# Patient Record
Sex: Female | Born: 1935 | Race: White | Hispanic: No | Smoking: Never smoker
Health system: Southern US, Community
[De-identification: ages and names within clinical notes are randomized; demographics above are authoritative.]

## PROBLEM LIST (undated history)

## (undated) DIAGNOSIS — F0280 Dementia in other diseases classified elsewhere without behavioral disturbance: Secondary | ICD-10-CM

## (undated) DIAGNOSIS — I639 Cerebral infarction, unspecified: Secondary | ICD-10-CM

## (undated) DIAGNOSIS — E785 Hyperlipidemia, unspecified: Secondary | ICD-10-CM

## (undated) DIAGNOSIS — I959 Hypotension, unspecified: Secondary | ICD-10-CM

## (undated) DIAGNOSIS — G3183 Dementia with Lewy bodies: Secondary | ICD-10-CM

## (undated) DIAGNOSIS — G20 Parkinson's disease: Secondary | ICD-10-CM

---

## 2018-06-03 ENCOUNTER — Emergency Department (HOSPITAL_COMMUNITY): Payer: Medicare Other

## 2018-06-03 ENCOUNTER — Emergency Department (HOSPITAL_COMMUNITY)
Admission: EM | Admit: 2018-06-03 | Discharge: 2018-06-03 | Disposition: A | Discharge status: Home / Self Care | Payer: Medicare Other | Attending: Emergency Medicine | Admitting: Emergency Medicine

## 2018-06-03 ENCOUNTER — Other Ambulatory Visit: Payer: Self-pay

## 2018-06-03 ENCOUNTER — Encounter (HOSPITAL_COMMUNITY): Payer: Self-pay | Admitting: *Deleted

## 2018-06-03 DIAGNOSIS — M7071 Other bursitis of hip, right hip: Secondary | ICD-10-CM | POA: Diagnosis not present

## 2018-06-03 DIAGNOSIS — Z8673 Personal history of transient ischemic attack (TIA), and cerebral infarction without residual deficits: Secondary | ICD-10-CM | POA: Insufficient documentation

## 2018-06-03 DIAGNOSIS — Y998 Other external cause status: Secondary | ICD-10-CM | POA: Diagnosis not present

## 2018-06-03 DIAGNOSIS — Z79899 Other long term (current) drug therapy: Secondary | ICD-10-CM | POA: Insufficient documentation

## 2018-06-03 DIAGNOSIS — Y9389 Activity, other specified: Secondary | ICD-10-CM | POA: Insufficient documentation

## 2018-06-03 DIAGNOSIS — X58XXXA Exposure to other specified factors, initial encounter: Secondary | ICD-10-CM | POA: Diagnosis not present

## 2018-06-03 DIAGNOSIS — G20 Parkinson's disease: Secondary | ICD-10-CM | POA: Diagnosis not present

## 2018-06-03 DIAGNOSIS — S7001XA Contusion of right hip, initial encounter: Secondary | ICD-10-CM

## 2018-06-03 DIAGNOSIS — F0390 Unspecified dementia without behavioral disturbance: Secondary | ICD-10-CM | POA: Insufficient documentation

## 2018-06-03 DIAGNOSIS — Y92129 Unspecified place in nursing home as the place of occurrence of the external cause: Secondary | ICD-10-CM | POA: Diagnosis not present

## 2018-06-03 DIAGNOSIS — Z7982 Long term (current) use of aspirin: Secondary | ICD-10-CM | POA: Insufficient documentation

## 2018-06-03 DIAGNOSIS — S79911A Unspecified injury of right hip, initial encounter: Secondary | ICD-10-CM | POA: Diagnosis present

## 2018-06-03 HISTORY — DX: Dementia in other diseases classified elsewhere, unspecified severity, without behavioral disturbance, psychotic disturbance, mood disturbance, and anxiety: F02.80

## 2018-06-03 HISTORY — DX: Hyperlipidemia, unspecified: E78.5

## 2018-06-03 HISTORY — DX: Cerebral infarction, unspecified: I63.9

## 2018-06-03 HISTORY — DX: Dementia with Lewy bodies: G31.83

## 2018-06-03 HISTORY — DX: Hypotension, unspecified: I95.9

## 2018-06-03 HISTORY — DX: Parkinson's disease: G20

## 2018-06-03 NOTE — ED Provider Notes (Signed)
[facility]-EMERGENCY DEPT Provider Note   CSN: [id number] Arrival date & time: 06/03/18  1208     History   Chief Complaint Chief Complaint  Patient presents with  . Rt hip Swelling    HPI Melissa Davidson is a 84 y.o. female.  The history is provided by the nursing home and the EMS personnel. The history is limited by the condition of the patient (Hx dementia).   Pt was seen at 1225. Per EMS and NH report: NH noticed "swelling" to her right hip. No reported shortening, rotation or bruising. Pt has significant hx of dementia and currently denies complaints.   Past Medical History:  Diagnosis Date  . Hyperlipidemia   . Hypotension   . Lewy body dementia (HCC)   . Parkinson disease (HCC)   . Stroke [facility])     There are no active problems to display for this patient.      OB History   None      Home Medications    Prior to Admission medications   Medication Sig Start Date End Date Taking? Authorizing Provider  aspirin 81 MG chewable tablet Chew 81 mg by mouth daily.   Yes [provider]  busPIRone (BUSPAR) 15 MG tablet Take 15 mg by mouth 3 (three) times daily.   Yes [provider]  carbidopa-levodopa (SINEMET CR) 50-200 MG tablet Take 1 tablet by mouth at bedtime. 9 pm   Yes [provider]  carbidopa-levodopa (SINEMET IR) 25-100 MG tablet Take 1 tablet by mouth 2 (two) times daily. 9 am and 3 pm   Yes [provider]  carboxymethylcellulose (REFRESH PLUS) 0.5 % SOLN Apply 1 drop to eye daily as needed (both eyes dry).   Yes [provider]  Cholecalciferol (VITAMIN D-3) 1000 units CAPS Take 2,000 Units by mouth daily.   Yes [provider]  diclofenac sodium (VOLTAREN) 1 % GEL Apply 2 g topically 4 (four) times daily.   Yes [provider]  docusate sodium (COLACE) 100 MG capsule Take 100 mg by mouth daily.   Yes [provider]  Eyelid Cleansers (OCUSOFT EYELID  CLEANSING EX) Apply 1 application topically every morning. Apply one lid cleanser pad on each eye every morning   Yes [provider]  LORazepam (ATIVAN) 0.5 MG tablet Take 0.5 mg by mouth every 4 (four) hours as needed for anxiety.   Yes [provider]  Melatonin 5 MG TABS Take 10 mg by mouth at bedtime.   Yes [provider]  Multiple Vitamin (MULTIVITAMIN WITH MINERALS) TABS tablet Take 1 tablet by mouth daily.   Yes [provider]  Pimavanserin Tartrate (NUPLAZID) 34 MG CAPS Take 34 mg by mouth daily.   Yes [provider]  QUEtiapine (SEROQUEL) 50 MG tablet Take 50 mg by mouth 2 (two) times daily. Take one at noon and one at bedtime   Yes [provider]  rotigotine (NEUPRO) 2 MG/24HR Place 1 patch onto the skin daily.   Yes [provider]  senna-docusate (SENOKOT-S) 8.6-50 MG tablet Take 1 tablet by mouth daily.   Yes [provider]  simvastatin (ZOCOR) 5 MG tablet Take 5 mg by mouth daily.   Yes [provider]  temazepam (RESTORIL) 15 MG capsule Take 15 mg by mouth at bedtime.   Yes [provider]  traZODone (DESYREL) 150 MG tablet Take 150 mg by mouth at bedtime.   Yes [provider]    Family History  No family history on file.  Social History Social History   Tobacco Use  . Smoking status: Never Smoker  . Smokeless tobacco: Never Used  Substance Use Topics  . Alcohol use: Not Currently  . Drug use: Never     Allergies   Sulfa antibiotics   Review of Systems Review of Systems  Unable to perform ROS: Dementia     Physical Exam Updated Vital Signs BP 100/62   Pulse 71   Temp 98.9 F (37.2 C) (Oral)   Resp 17   SpO2 100%   Physical Exam 1230: Physical examination:  Nursing notes reviewed; Vital signs and O2 SAT reviewed;  Constitutional: Well developed, Well nourished, Well hydrated, In no acute distress; Head:  Normocephalic, atraumatic; Eyes: EOMI, PERRL,  No scleral icterus; ENMT: Mouth and pharynx normal, Mucous membranes moist; Neck: Supple, Full range of motion, No lymphadenopathy; Cardiovascular: Regular rate and rhythm, No gallop; Respiratory: Breath sounds clear & equal bilaterally, No wheezes. Normal respiratory effort/excursion; Chest: Nontender, Movement normal; Abdomen: Soft, Nontender, Nondistended, Normal bowel sounds; Genitourinary: No CVA tenderness; Spine:  No midline CS, TS, LS tenderness.;; Extremities: Peripheral pulses normal, Pelvis stable. +possible mild asymmetry to right lateral hip compared to left. No tenderness, No deformity, no shortening, no rotation, no bruising, no rash, no open wounds.  No calf edema or asymmetry.; Neuro: Awake, alert, confused per hx dementia. No facial droop. Speaks few words clearly. Pt will move all extremities on stretcher, including lifting bilat LE's, spontaneously and to command without apparent gross focal motor deficits.; Skin: Color normal, Warm, Dry.   ED Treatments / Results  Labs (all labs ordered are listed, but only abnormal results are displayed)   EKG None  Radiology   Procedures Procedures (including critical care time)  Medications Ordered in ED Medications - No data to display   Initial Impression / Assessment and Plan / ED Course  I have reviewed the triage vital signs and the nursing notes.  Pertinent labs & imaging results that were available during my care of the patient were reviewed by me and considered in my medical decision making (see chart for details).  MDM Reviewed: nursing note, vitals and previous chart Interpretation: x-ray and CT scan     Ct Hip Right Wo Contrast Result Date: 06/03/2018 CLINICAL DATA:  Right hip pain and swelling. The patient is unable to provide history and it is unclear if she fell. Initial encounter. EXAM: CT OF THE RIGHT HIP WITHOUT CONTRAST TECHNIQUE: Multidetector CT imaging of the right hip was performed according to the  standard protocol. Multiplanar CT image reconstructions were also generated. COMPARISON:  Plain films of both hips earlier today. FINDINGS: Bones/Joint/Cartilage Bones are osteopenic. The right hip is located. No fracture is identified. No focal bony lesion. No avascular necrosis of the right femoral head. Vacuum disc phenomenon and a disc bulge at L5-S1 are noted. Ligaments Suboptimally assessed by CT. Muscles and Tendons Appear intact. Soft tissues There is stranding in subcutaneous fat adjacent to the right hip. Small subcutaneous calcifications over the right hip and buttock are likely injection granulomas. There appears to be fluid in the right trochanteric bursa. IMPRESSION: Negative for fracture or other acute bony or joint abnormality. Osteopenia limits sensitivity for hip fracture on CT. If the patient is unable to bear weight, MRI without contrast is recommended for further evaluation. Stranding in subcutaneous fat over the right hip may be due to contusion. Fluid in the right trochanteric bursa compatible with bursitis. Electronically Signed  By: Drusilla Kanner M.D.   On: 06/03/2018 15:35   Dg Hips Bilat W Or Wo Pelvis 5 Views Result Date: 06/03/2018 CLINICAL DATA:  Hip pain and swelling. It is unknown if the patient fell. Initial encounter. EXAM: DG HIP (WITH OR WITHOUT PELVIS) 5+V BILAT COMPARISON:  None. FINDINGS: There is no evidence of hip fracture or dislocation. There is no evidence of arthropathy or other focal bone abnormality. IMPRESSION: Negative exam. Electronically Signed   By: Drusilla Kanner M.D.   On: 06/03/2018 13:15    1540:  CT as above. Pt without pain on exam, no rash. Tx symptomatically, f/u PMD. Will d/c back to NH.      Final Clinical Impressions(s) / ED Diagnoses   Final diagnoses:  None    ED Discharge Orders    None       Samuel Jester, DO 06/07/18 1040

## 2018-06-03 NOTE — Discharge Instructions (Signed)
Take your usual prescriptions as previously directed.  Apply moist heat or ice to the area(s) of discomfort, for 15 minutes at a time, several times per day for the next few days.  Do not fall asleep on a heating or ice pack.  Call your regular medical doctor on Monday to schedule a follow up appointment in the next 3 days.  Return to the Emergency Department immediately if worsening.  ° °

## 2018-06-03 NOTE — ED Notes (Signed)
Pt transported back to [facility] by [facility]

## 2018-06-03 NOTE — ED Notes (Signed)
Pt sleeping at present, waiting for CT scan

## 2018-06-03 NOTE — ED Notes (Signed)
Pt moved to room and undressed, no signs of injury, slight soft tissue swelling noted.

## 2018-06-03 NOTE — ED Triage Notes (Addendum)
[facility] reports pt from [facility], swelling to right hip unknown if related to fall. No noted distress, No shortening, rotation or bruising 102/68-78-16-98% RA

## 2018-06-05 ENCOUNTER — Emergency Department (HOSPITAL_COMMUNITY): Payer: Medicare Other

## 2018-06-05 ENCOUNTER — Encounter (HOSPITAL_COMMUNITY): Payer: Self-pay | Admitting: Emergency Medicine

## 2018-06-05 ENCOUNTER — Emergency Department (HOSPITAL_COMMUNITY)
Admission: EM | Admit: 2018-06-05 | Discharge: 2018-06-05 | Disposition: A | Discharge status: Home / Self Care | Payer: Medicare Other | Attending: Emergency Medicine | Admitting: Emergency Medicine

## 2018-06-05 ENCOUNTER — Other Ambulatory Visit: Payer: Self-pay

## 2018-06-05 DIAGNOSIS — W19XXXA Unspecified fall, initial encounter: Secondary | ICD-10-CM | POA: Diagnosis not present

## 2018-06-05 DIAGNOSIS — Y999 Unspecified external cause status: Secondary | ICD-10-CM | POA: Insufficient documentation

## 2018-06-05 DIAGNOSIS — Y92129 Unspecified place in nursing home as the place of occurrence of the external cause: Secondary | ICD-10-CM | POA: Insufficient documentation

## 2018-06-05 DIAGNOSIS — Z79899 Other long term (current) drug therapy: Secondary | ICD-10-CM | POA: Diagnosis not present

## 2018-06-05 DIAGNOSIS — G20 Parkinson's disease: Secondary | ICD-10-CM | POA: Insufficient documentation

## 2018-06-05 DIAGNOSIS — S20212A Contusion of left front wall of thorax, initial encounter: Secondary | ICD-10-CM | POA: Diagnosis not present

## 2018-06-05 DIAGNOSIS — Y939 Activity, unspecified: Secondary | ICD-10-CM | POA: Diagnosis not present

## 2018-06-05 DIAGNOSIS — Z7982 Long term (current) use of aspirin: Secondary | ICD-10-CM | POA: Insufficient documentation

## 2018-06-05 DIAGNOSIS — S299XXA Unspecified injury of thorax, initial encounter: Secondary | ICD-10-CM | POA: Diagnosis present

## 2018-06-05 MED ORDER — ACETAMINOPHEN 325 MG PO TABS
650.0000 mg | ORAL_TABLET | Freq: Once | ORAL | Status: AC
  Administered 2018-06-05: 650 mg via ORAL
  Filled 2018-06-05: qty 2

## 2018-06-05 NOTE — ED Triage Notes (Signed)
Patient arrives with c/o witnessed fall. No LOC, did not hit head. Right rib pain. Hx dementia. No obvious injuries. From [facility].

## 2018-06-05 NOTE — ED Notes (Signed)
[facility] called, report given.

## 2018-06-05 NOTE — ED Notes (Signed)
[facility] called for transport.  

## 2018-06-05 NOTE — Discharge Instructions (Signed)
It was our pleasure to provide your ER care today - we hope that you feel better.  The xrays were read by our radiologist as showing no acute fracture.  Take acetaminophen as need.  Return to ER if worse, new symptoms, new or severe pain, fevers, trouble breathing, other concern.

## 2018-06-05 NOTE — ED Provider Notes (Signed)
[facility]-EMERGENCY DEPT Provider Note   CSN: [id number] Arrival date & time: 06/05/18  1624     History   Chief Complaint Chief Complaint  Patient presents with  . Fall  . Rib Injury    HPI Melissa Davidson is a 84 y.o. female.  Pt with hx falls, s/p witnessed fall at [facility]. No loc or head injury w fall. Pts mental status has remained c/w baseline post fall. No vomiting post fall. Skin intact, no bleeding post fall .Pt c/o left lower/lateral rib pain. Patient limited historian due to dementia - level 5 caveat. Denies other pain or injury.   The history is provided by the patient and the EMS personnel.  Fall     Past Medical History:  Diagnosis Date  . Hyperlipidemia   . Hypotension   . Lewy body dementia (HCC)   . Parkinson disease (HCC)   . Stroke [facility])     There are no active problems to display for this patient.   History reviewed. No pertinent surgical history.   OB History   None      Home Medications    Prior to Admission medications   Medication Sig Start Date End Date Taking? Authorizing Provider  aspirin 81 MG chewable tablet Chew 81 mg by mouth daily.    [provider]  busPIRone (BUSPAR) 15 MG tablet Take 15 mg by mouth 3 (three) times daily.    [provider]  carbidopa-levodopa (SINEMET CR) 50-200 MG tablet Take 1 tablet by mouth at bedtime. 9 pm    [provider]  carbidopa-levodopa (SINEMET IR) 25-100 MG tablet Take 1 tablet by mouth 2 (two) times daily. 9 am and 3 pm    [provider]  carboxymethylcellulose (REFRESH PLUS) 0.5 % SOLN Apply 1 drop to eye daily as needed (both eyes dry).    [provider]  Cholecalciferol (VITAMIN D-3) 1000 units CAPS Take 2,000 Units by mouth daily.    [provider]  diclofenac sodium (VOLTAREN) 1 % GEL Apply 2 g topically 4 (four) times daily.    [provider]  docusate sodium (COLACE) 100 MG capsule Take 100 mg by  mouth daily.    [provider]  Eyelid Cleansers (OCUSOFT EYELID CLEANSING EX) Apply 1 application topically every morning. Apply one lid cleanser pad on each eye every morning    [provider]  LORazepam (ATIVAN) 0.5 MG tablet Take 0.5 mg by mouth every 4 (four) hours as needed for anxiety.    [provider]  Melatonin 5 MG TABS Take 10 mg by mouth at bedtime.    [provider]  Multiple Vitamin (MULTIVITAMIN WITH MINERALS) TABS tablet Take 1 tablet by mouth daily.    [provider]  Pimavanserin Tartrate (NUPLAZID) 34 MG CAPS Take 34 mg by mouth daily.    [provider]  QUEtiapine (SEROQUEL) 50 MG tablet Take 50 mg by mouth 2 (two) times daily. Take one at noon and one at bedtime    [provider]  rotigotine (NEUPRO) 2 MG/24HR Place 1 patch onto the skin daily.    [provider]  senna-docusate (SENOKOT-S) 8.6-50 MG tablet Take 1 tablet by mouth daily.    [provider]  simvastatin (ZOCOR) 5 MG tablet Take 5 mg by mouth daily.    [provider]  temazepam (RESTORIL) 15 MG capsule Take 15 mg by mouth at bedtime.    [provider]  traZODone (DESYREL)  150 MG tablet Take 150 mg by mouth at bedtime.    [provider]    Family History History reviewed. No pertinent family history.  Social History Social History   Tobacco Use  . Smoking status: Never Smoker  . Smokeless tobacco: Never Used  Substance Use Topics  . Alcohol use: Not Currently  . Drug use: Never     Allergies   Sulfa antibiotics   Review of Systems Review of Systems  Unable to perform ROS: Dementia  level 5 caveat.     Physical Exam Updated Vital Signs BP 138/77 (BP Location: Right Arm)   Pulse 93   Temp 97.8 F (36.6 C) (Oral)   Resp 14   SpO2 92%   Physical Exam  Constitutional: She appears well-developed and well-nourished.  HENT:  Head: Atraumatic.  Mouth/Throat: Oropharynx is  clear and moist.  Eyes: Pupils are equal, round, and reactive to light. Conjunctivae are normal. No scleral icterus.  Neck: Neck supple. No tracheal deviation present.  Cardiovascular: Normal rate, regular rhythm, normal heart sounds and intact distal pulses.  Pulmonary/Chest: Effort normal and breath sounds normal. No respiratory distress. She exhibits tenderness.  Left chest wall tenderness. Normal chest wall movement. No crepitus.   Abdominal: Soft. Normal appearance and bowel sounds are normal. She exhibits no distension. There is no tenderness.  No abd wall contusion, bruising, or tenderness.   Genitourinary:  Genitourinary Comments: No cva tenderness.   Musculoskeletal: She exhibits no edema.  CTLS spine, non tender, aligned, no step off. Good rom bil ext without pain or focal bony tenderness.   Neurological: She is alert.  Skin: Skin is warm and dry. No rash noted.  Intact, no lacerations.   Psychiatric:  Hx dementia. Confusion.   Nursing note and vitals reviewed.    ED Treatments / Results  Labs (all labs ordered are listed, but only abnormal results are displayed) Labs Reviewed - No data to display  EKG None  Radiology Dg Ribs Unilateral W/chest Left  Result Date: 06/05/2018 CLINICAL DATA:  Witnessed fall.  LEFT upper chest pain. EXAM: LEFT RIBS AND CHEST - 3+ VIEW COMPARISON:  None. FINDINGS: No fracture or other bone lesions are seen involving the ribs. There is no evidence of pneumothorax or pleural effusion. Both lungs are clear. Heart size and mediastinal contours are within normal limits. IMPRESSION: Negative. Electronically Signed   By: Awilda Metro M.D.   On: 06/05/2018 17:51    Procedures Procedures (including critical care time)  Medications Ordered in ED Medications - No data to display   Initial Impression / Assessment and Plan / ED Course  I have reviewed the triage vital signs and the nursing notes.  Pertinent labs & imaging results that were  available during my care of the patient were reviewed by me and considered in my medical decision making (see chart for details).  Imaging ordered.   Reviewed nursing notes and prior charts for additional history.   xrays reviewed - no acute fx.   Acetaminophen po.  Pt comfortable appearing, nad - pt currently appears stable for d/c.     Final Clinical Impressions(s) / ED Diagnoses   Final diagnoses:  None    ED Discharge Orders    None       Cathren Laine, MD 06/05/18 [id number]

## 2018-06-05 NOTE — ED Notes (Signed)
Bed: WA02 Expected date:  Expected time:  Means of arrival:  Comments: EMS 80s pain

## 2018-07-03 ENCOUNTER — Other Ambulatory Visit: Payer: Self-pay

## 2018-07-03 ENCOUNTER — Emergency Department (HOSPITAL_COMMUNITY)
Admission: EM | Admit: 2018-07-03 | Discharge: 2018-07-04 | Disposition: A | Discharge status: Home / Self Care | Payer: Medicare Other | Attending: Emergency Medicine | Admitting: Emergency Medicine

## 2018-07-03 DIAGNOSIS — S0101XA Laceration without foreign body of scalp, initial encounter: Secondary | ICD-10-CM | POA: Diagnosis not present

## 2018-07-03 DIAGNOSIS — Z7982 Long term (current) use of aspirin: Secondary | ICD-10-CM | POA: Diagnosis not present

## 2018-07-03 DIAGNOSIS — L03032 Cellulitis of left toe: Secondary | ICD-10-CM | POA: Diagnosis not present

## 2018-07-03 DIAGNOSIS — S0990XA Unspecified injury of head, initial encounter: Secondary | ICD-10-CM | POA: Diagnosis present

## 2018-07-03 DIAGNOSIS — W01198A Fall on same level from slipping, tripping and stumbling with subsequent striking against other object, initial encounter: Secondary | ICD-10-CM | POA: Diagnosis not present

## 2018-07-03 DIAGNOSIS — Y9389 Activity, other specified: Secondary | ICD-10-CM | POA: Insufficient documentation

## 2018-07-03 DIAGNOSIS — Y92129 Unspecified place in nursing home as the place of occurrence of the external cause: Secondary | ICD-10-CM | POA: Diagnosis not present

## 2018-07-03 DIAGNOSIS — Y999 Unspecified external cause status: Secondary | ICD-10-CM | POA: Insufficient documentation

## 2018-07-03 DIAGNOSIS — W19XXXA Unspecified fall, initial encounter: Secondary | ICD-10-CM

## 2018-07-03 DIAGNOSIS — G20 Parkinson's disease: Secondary | ICD-10-CM | POA: Insufficient documentation

## 2018-07-03 DIAGNOSIS — B353 Tinea pedis: Secondary | ICD-10-CM | POA: Diagnosis not present

## 2018-07-03 DIAGNOSIS — Z79899 Other long term (current) drug therapy: Secondary | ICD-10-CM | POA: Insufficient documentation

## 2018-07-03 NOTE — ED Notes (Signed)
Bed: [id number] Expected date:  Expected time:  Means of arrival:  Comments: 84 yo F/SNF Fall

## 2018-07-03 NOTE — ED Provider Notes (Signed)
[facility]-EMERGENCY DEPT Provider Note   CSN: [id number] Arrival date & time: 07/03/18  2323     History   Chief Complaint Chief Complaint  Patient presents with  . Fall    HPI Melissa Davidson is a 84 y.o. female.  Patient presents to the emergency department after unwitnessed fall.  She comes from skilled nursing home.  Patient was found sitting on her bed with a head wound.  Staff thinks she fell forward and hit her wheelchair.  Patient noted to have bleeding from the right side of her scalp initially, bleeding has since stopped.  Patient has Lewy body dementia, does not answer questions appropriately.Level V Caveat due to dementia.     Past Medical History:  Diagnosis Date  . Hyperlipidemia   . Hypotension   . Lewy body dementia (HCC)   . Parkinson disease (HCC)   . Stroke [facility])     There are no active problems to display for this patient.   No past surgical history on file.   OB History   None      Home Medications    Prior to Admission medications   Medication Sig Start Date End Date Taking? Authorizing Provider  aspirin 81 MG chewable tablet Chew 81 mg by mouth daily.    [provider]  busPIRone (BUSPAR) 15 MG tablet Take 15 mg by mouth 3 (three) times daily.    [provider]  carbidopa-levodopa (SINEMET CR) 50-200 MG tablet Take 1 tablet by mouth at bedtime. 9 pm    [provider]  carbidopa-levodopa (SINEMET IR) 25-100 MG tablet Take 1 tablet by mouth 2 (two) times daily. 9 am and 3 pm    [provider]  carboxymethylcellulose (REFRESH PLUS) 0.5 % SOLN Apply 1 drop to eye daily as needed (both eyes dry).    [provider]  Cholecalciferol (VITAMIN D-3) 1000 units CAPS Take 2,000 Units by mouth daily.    [provider]  clotrimazole (LOTRIMIN) 1 % cream Apply to area between toes on left foot area 2 times daily 07/04/18   Mariette Cowley, Canary Brim, MD  diclofenac sodium  (VOLTAREN) 1 % GEL Apply 2 g topically 4 (four) times daily.    [provider]  docusate sodium (COLACE) 100 MG capsule Take 100 mg by mouth daily.    [provider]  doxycycline (VIBRAMYCIN) 100 MG capsule Take 1 capsule (100 mg total) by mouth 2 (two) times daily. 07/04/18   Gilda Crease, MD  Eyelid Cleansers (OCUSOFT EYELID CLEANSING EX) Apply 1 application topically every morning. Apply one lid cleanser pad on each eye every morning    [provider]  LORazepam (ATIVAN) 0.5 MG tablet Take 0.5 mg by mouth every 4 (four) hours as needed for anxiety.    [provider]  Melatonin 5 MG TABS Take 10 mg by mouth at bedtime.    [provider]  Multiple Vitamin (MULTIVITAMIN WITH MINERALS) TABS tablet Take 1 tablet by mouth daily.    [provider]  Pimavanserin Tartrate (NUPLAZID) 34 MG CAPS Take 34 mg by mouth daily.    [provider]  QUEtiapine (SEROQUEL) 50 MG tablet Take 50 mg by mouth 2 (two) times daily. Take one at noon and one at bedtime    [provider]  rotigotine (NEUPRO) 2 MG/24HR Place 1 patch onto the skin daily.    [provider]  senna-docusate (SENOKOT-S) 8.6-50 MG tablet Take 1 tablet by mouth  daily.    [provider]  simvastatin (ZOCOR) 5 MG tablet Take 5 mg by mouth daily.    [provider]  temazepam (RESTORIL) 15 MG capsule Take 15 mg by mouth at bedtime.    [provider]  traZODone (DESYREL) 150 MG tablet Take 150 mg by mouth at bedtime.    [provider]    Family History No family history on file.  Social History Social History   Tobacco Use  . Smoking status: Never Smoker  . Smokeless tobacco: Never Used  Substance Use Topics  . Alcohol use: Not Currently  . Drug use: Never     Allergies   Sulfa antibiotics   Review of Systems Review of Systems  Unable to perform ROS: Dementia     Physical Exam Updated Vital  Signs BP 121/60   Pulse 71   Temp 97.8 F (36.6 C) (Oral)   Resp 12   SpO2 100%   Physical Exam  Constitutional: She is oriented to person, place, and time. She appears well-developed and well-nourished. No distress.  HENT:  Head: Normocephalic. Head is with laceration.    Right Ear: Hearing normal.  Left Ear: Hearing normal.  Nose: Nose normal.  Mouth/Throat: Oropharynx is clear and moist and mucous membranes are normal.  Eyes: Pupils are equal, round, and reactive to light. Conjunctivae and EOM are normal.  Neck: Normal range of motion. Neck supple.  Cardiovascular: Regular rhythm, S1 normal and S2 normal. Exam reveals no gallop and no friction rub.  No murmur heard. Pulmonary/Chest: Effort normal and breath sounds normal. No respiratory distress. She exhibits no tenderness.  Abdominal: Soft. Normal appearance and bowel sounds are normal. There is no hepatosplenomegaly. There is no tenderness. There is no rebound, no guarding, no tenderness at McBurney's point and negative Murphy's sign. No hernia.  Musculoskeletal: Normal range of motion.  Neurological: She is alert and oriented to person, place, and time. She has normal strength. No cranial nerve deficit or sensory deficit. Coordination normal. GCS eye subscore is 4. GCS verbal subscore is 5. GCS motor subscore is 6.  Skin: Skin is warm and dry. Laceration noted. No rash noted. No cyanosis.  Heavy callus formation in the webspace between the left fourth and fifth toes.  Erythema of the surrounding dorsal skin.  Psychiatric: She has a normal mood and affect. Her speech is normal and behavior is normal. Thought content normal.  Nursing note and vitals reviewed.    ED Treatments / Results  Labs (all labs ordered are listed, but only abnormal results are displayed) Labs Reviewed - No data to display  EKG None  Radiology Dg Chest 1 View  Result Date: 07/04/2018 CLINICAL DATA:  Possible fall EXAM: CHEST  1 VIEW COMPARISON:   None. FINDINGS: Nodular opacity projecting over the right upper lobe is favored to be external possibly a cardiac lead. Otherwise, the lungs are clear. Normal cardiomediastinal contours. No pleural effusion. IMPRESSION: Nodular opacity projecting over the right upper lobe, favored to be external. Correlate with placement of ECG leads. Alternatively, consider upright PA and lateral radiographs for better localization. Electronically Signed   By: Deatra Robinson M.D.   On: 07/04/2018 01:04   Dg Thoracic Spine 2 View  Result Date: 07/04/2018 CLINICAL DATA:  Pain after fall EXAM: THORACIC SPINE 2 VIEWS COMPARISON:  None. FINDINGS: Minimal anterior wedging of a lower thoracic vertebral body with less than 10% loss of anterior height. Anterior wedging of an upper thoracic vertebral body with  approximately 50% loss of anterior height. Vertebral bodies are otherwise normal other than mild degenerative changes. IMPRESSION: Age-indeterminate compression fractures of a lower thoracic vertebral body and an upper thoracic vertebral body. See above for details. Recommend clinical correlation. Electronically Signed   By: Gerome Samavid  Williams III M.D   On: 07/04/2018 01:06   Dg Lumbar Spine Complete  Result Date: 07/04/2018 CLINICAL DATA:  Pain after fall EXAM: LUMBAR SPINE - COMPLETE 4+ VIEW COMPARISON:  None. FINDINGS: Grade 1 anterolisthesis of L4 versus L5. No other malalignment. No convincing evidence of fracture. Lower lumbar facet degenerative changes. IMPRESSION: No fracture or traumatic malalignment. Lower lumbar facet degenerative changes. Electronically Signed   By: Gerome Samavid  Williams III M.D   On: 07/04/2018 01:07   Dg Pelvis 1-2 Views  Result Date: 07/04/2018 CLINICAL DATA:  Pain after fall EXAM: PELVIS - 1-2 VIEW COMPARISON:  None. FINDINGS: There is no evidence of pelvic fracture or diastasis. No pelvic bone lesions are seen. IMPRESSION: Negative. Electronically Signed   By: Gerome Samavid  Williams III M.D   On:  07/04/2018 01:07   Ct Head Wo Contrast  Result Date: 07/04/2018 CLINICAL DATA:  Unwitnessed fall. EXAM: CT HEAD WITHOUT CONTRAST CT CERVICAL SPINE WITHOUT CONTRAST TECHNIQUE: Multidetector CT imaging of the head and cervical spine was performed following the standard protocol without intravenous contrast. Multiplanar CT image reconstructions of the cervical spine were also generated. COMPARISON:  None. FINDINGS: CT HEAD FINDINGS Brain: No subdural, epidural, or subarachnoid hemorrhage. Cerebellum, brainstem, and basal cisterns are normal. Ventricles and sulci are prominent, likely due to volume loss. Moderate white matter changes are noted. No acute cortical ischemia or infarct identified. No mass effect or midline shift noted. Vascular: Calcified atherosclerosis in the intracranial carotids. Skull: Normal. Negative for fracture or focal lesion. Sinuses/Orbits: No acute finding. Other: None. CT CERVICAL SPINE FINDINGS Alignment: 2 mm of anterolisthesis of C4 versus C5 with no associated fracture. 1 mm of anterolisthesis of C5 versus C6, also with no associated fracture. No other malalignment. Skull base and vertebrae: No acute fracture. No primary bone lesion or focal pathologic process. Soft tissues and spinal canal: No prevertebral fluid or swelling. No visible canal hematoma. Disc levels:  Multilevel degenerative changes. Upper chest: Negative. Other: No other abnormalities. IMPRESSION: 1. No acute intracranial abnormalities. Moderate white matter changes. 2. No fracture or traumatic malalignment in the cervical spine. Minimal anterolisthesis of C4 versus C5 and C5 versus C6 is likely due to facet degenerative changes at these levels. Electronically Signed   By: Gerome Samavid  Williams III M.D   On: 07/04/2018 01:03   Ct Cervical Spine Wo Contrast  Result Date: 07/04/2018 CLINICAL DATA:  Unwitnessed fall. EXAM: CT HEAD WITHOUT CONTRAST CT CERVICAL SPINE WITHOUT CONTRAST TECHNIQUE: Multidetector CT imaging of  the head and cervical spine was performed following the standard protocol without intravenous contrast. Multiplanar CT image reconstructions of the cervical spine were also generated. COMPARISON:  None. FINDINGS: CT HEAD FINDINGS Brain: No subdural, epidural, or subarachnoid hemorrhage. Cerebellum, brainstem, and basal cisterns are normal. Ventricles and sulci are prominent, likely due to volume loss. Moderate white matter changes are noted. No acute cortical ischemia or infarct identified. No mass effect or midline shift noted. Vascular: Calcified atherosclerosis in the intracranial carotids. Skull: Normal. Negative for fracture or focal lesion. Sinuses/Orbits: No acute finding. Other: None. CT CERVICAL SPINE FINDINGS Alignment: 2 mm of anterolisthesis of C4 versus C5 with no associated fracture. 1 mm of anterolisthesis of C5 versus C6, also with no associated  fracture. No other malalignment. Skull base and vertebrae: No acute fracture. No primary bone lesion or focal pathologic process. Soft tissues and spinal canal: No prevertebral fluid or swelling. No visible canal hematoma. Disc levels:  Multilevel degenerative changes. Upper chest: Negative. Other: No other abnormalities. IMPRESSION: 1. No acute intracranial abnormalities. Moderate white matter changes. 2. No fracture or traumatic malalignment in the cervical spine. Minimal anterolisthesis of C4 versus C5 and C5 versus C6 is likely due to facet degenerative changes at these levels. Electronically Signed   By: Gerome Sam III M.D   On: 07/04/2018 01:03    Procedures .Marland KitchenLaceration Repair Date/Time: 07/04/2018 2:48 AM Performed by: Gilda Crease, MD Authorized by: Gilda Crease, MD   Consent:    Consent obtained:  Verbal   Consent given by:  Healthcare agent (daughter)   Risks discussed:  Infection Universal protocol:    Procedure explained and questions answered to patient or proxy's satisfaction: yes     Relevant documents  present and verified: yes     Test results available and properly labeled: yes     Imaging studies available: yes     Required blood products, implants, devices, and special equipment available: yes     Site/side marked: yes     Immediately prior to procedure, a time out was called: yes     Patient identity confirmed:  Hospital-assigned identification number Anesthesia (see MAR for exact dosages):    Anesthesia method:  Local infiltration   Local anesthetic:  Lidocaine 2% WITH epi Laceration details:    Location:  Scalp   Scalp location:  R parietal   Length (cm):  3 Repair type:    Repair type:  Simple Pre-procedure details:    Preparation:  Patient was prepped and draped in usual sterile fashion and imaging obtained to evaluate for foreign bodies Exploration:    Hemostasis achieved with:  Epinephrine and direct pressure   Contaminated: no   Treatment:    Area cleansed with:  Betadine   Irrigation solution:  Sterile saline Skin repair:    Repair method:  Staples   Number of staples:  6 Approximation:    Approximation:  Close Post-procedure details:    Dressing:  Open (no dressing)   (including critical care time)  Medications Ordered in ED Medications  lidocaine-EPINEPHrine (XYLOCAINE W/EPI) 2 %-1:100000 (with pres) injection 20 mL (has no administration in time range)     Initial Impression / Assessment and Plan / ED Course  I have reviewed the triage vital signs and the nursing notes.  Pertinent labs & imaging results that were available during my care of the patient were reviewed by me and considered in my medical decision making (see chart for details).     Patient presents to the emergency department for evaluation after a fall.  Patient had an unwitnessed fall at skilled nursing facility.  The only outward sign of injury is a scalp laceration.  CT head and cervical spine did not show any acute abnormality.  She is moving all 4 extremities without difficulty.  She  does not appear to have any tenderness over midline thoracic or lumbar spine.  Because of her dementia, additional images were performed.  These images were negative other than some evidence of thoracic and lumbar compressions.  Daughter feels that she has had fractures in the past.  Examination does not reveal any thoracic tenderness, suspect that her thoracic fractures are old as are her lumbar fractures.  She has heavy callus  formation in the webspace between the left fourth and fifth toes.  There is some discoloration to the callus and some erythema of the foot in the area.  We will treat topically with clotrimazole and also empirically with oral doxycycline for possible early cellulitis.  She will be returned to the nursing home, will need staple removal in 10 days.  Final Clinical Impressions(s) / ED Diagnoses   Final diagnoses:  Fall, initial encounter  Laceration of scalp, initial encounter  Tinea pedis of left foot  Cellulitis of toe of left foot    ED Discharge Orders         Ordered    clotrimazole (LOTRIMIN) 1 % cream     07/04/18 0254    doxycycline (VIBRAMYCIN) 100 MG capsule  2 times daily     07/04/18 0254           Gilda Crease, MD 07/04/18 [phone]

## 2018-07-03 NOTE — ED Triage Notes (Signed)
Found sitting on the edge of her bed in memory care with dried blood at the R side of her head.  Small hematoma on the R side of her head under dried blood.  [facility]  It appears that patient fell and hit her head on foot rest on wheelchair.  No active bleeding upon EMS arrival.  GCS 11 now and at baseline.  Per [facility] somestimes pt obey commands and vocalizes, other times no.  Hx lewy body dementia.  c-collars available on ambulance did not fit, so patient has blanket supporting cervical spine.  Kyphosis noted.  Pt does not appear in pain.

## 2018-07-04 ENCOUNTER — Emergency Department (HOSPITAL_COMMUNITY): Payer: Medicare Other

## 2018-07-04 DIAGNOSIS — S0101XA Laceration without foreign body of scalp, initial encounter: Secondary | ICD-10-CM | POA: Diagnosis not present

## 2018-07-04 MED ORDER — DOXYCYCLINE HYCLATE 100 MG PO CAPS: 100.0000 mg | ORAL_CAPSULE | Freq: Two times a day (BID) | ORAL | 0 refills | Status: AC

## 2018-07-04 MED ORDER — CLOTRIMAZOLE 1 % EX CREA: TOPICAL_CREAM | CUTANEOUS | 0 refills | Status: AC

## 2018-07-04 MED ORDER — LIDOCAINE-EPINEPHRINE 2 %-1:100000 IJ SOLN
20.0000 mL | Freq: Once | INTRAMUSCULAR | Status: AC
  Administered 2018-07-04: 1 mL
  Filled 2018-07-04: qty 20

## 2018-07-04 NOTE — ED Notes (Signed)
Pt's head cleaned and MD at bedside.

## 2018-07-04 NOTE — ED Notes (Signed)
[facility]  Pt went back with her daughter.  DNR form left at ED.  Receiving RN and daughter aware, daughter to return to pick up DNR form now.

## 2018-07-04 NOTE — Discharge Instructions (Addendum)
Place the athlete's foot cream between the fourth and fifth toes of the left foot twice a day.  Finished antibiotic as prescribed.  Staples will need to be removed in 10 days.

## 2018-07-04 NOTE — ED Notes (Signed)
Pt assisted to bathroom using steady with charge RN and this RN.  Pt at baseline with assistance ambulating.

## 2018-09-16 DEATH — deceased

## 2019-11-04 IMAGING — CR DG CHEST 1V
1 series · 1 of 1 positions shown · non-contrast
Comparison: None.

CLINICAL DATA: Possible fall

EXAM:
CHEST  1 VIEW

[t chest supine]
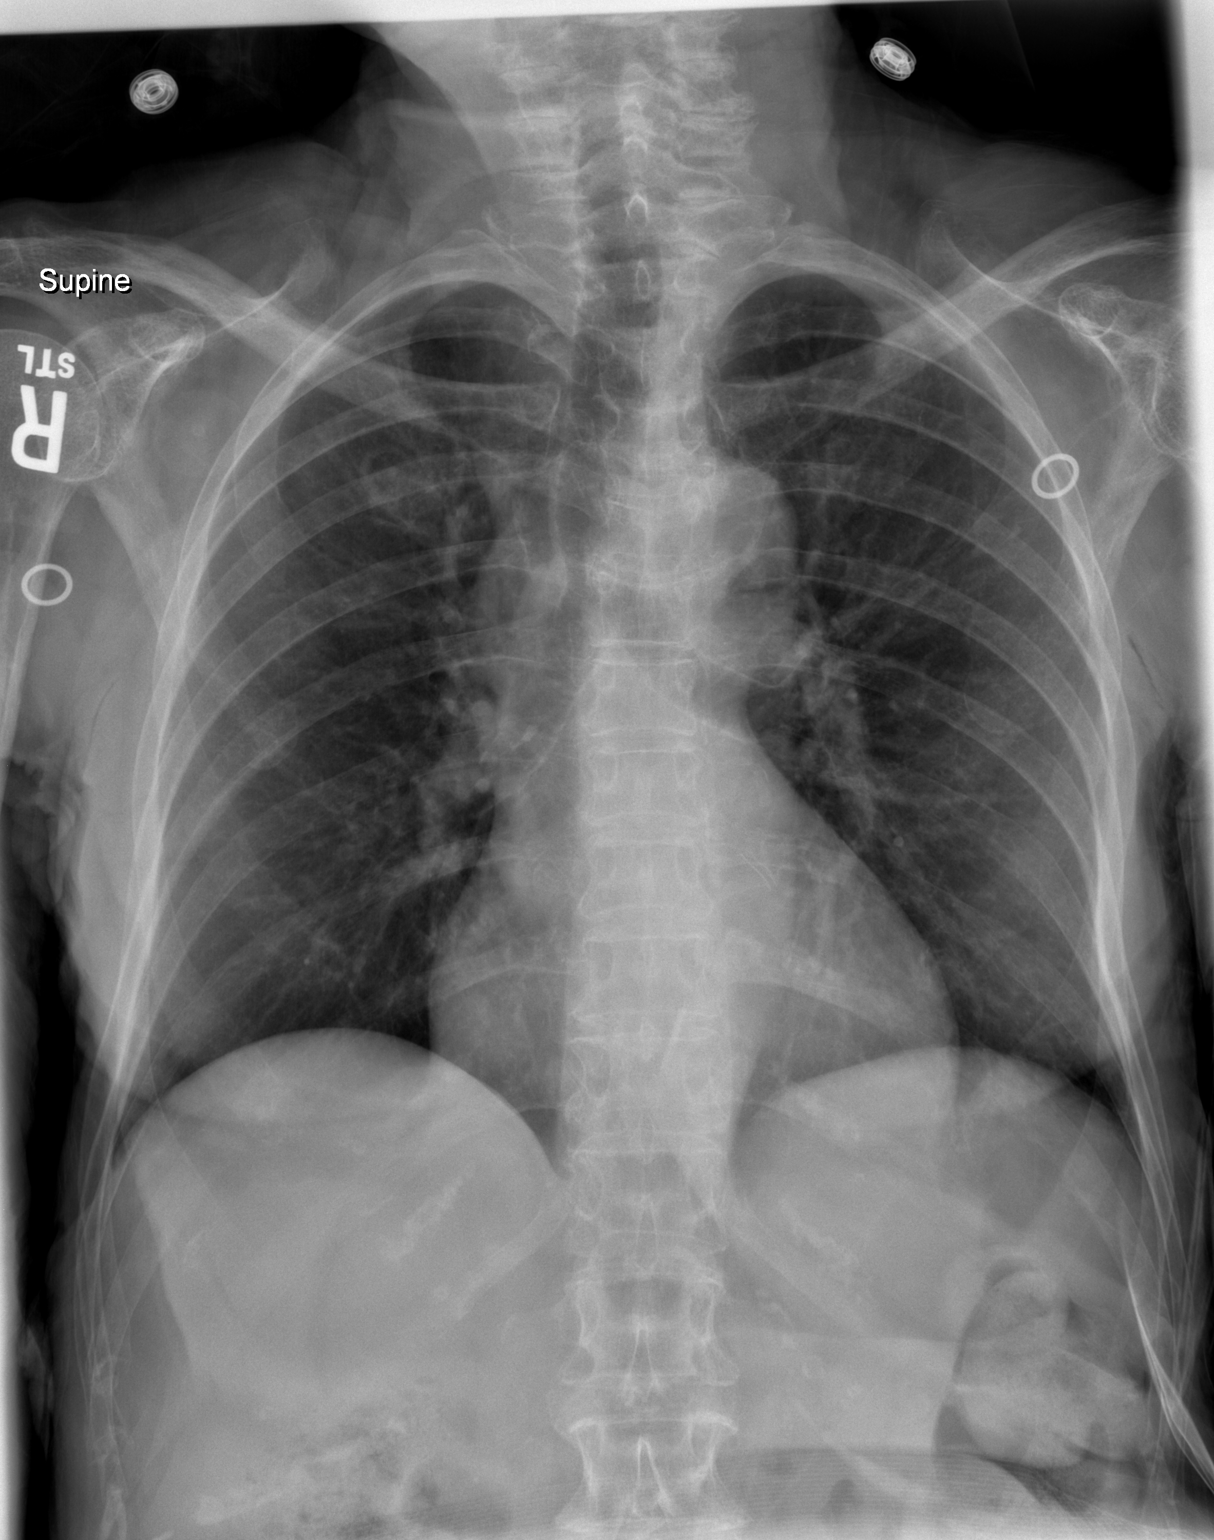

[1 of 1 positions shown; findings below may reference images not displayed]

FINDINGS: Nodular opacity projecting over the right upper lobe is favored to
be external possibly a cardiac lead. Otherwise, the lungs are clear.
Normal cardiomediastinal contours. No pleural effusion.
IMPRESSION: Nodular opacity projecting over the right upper lobe, favored to be
external. Correlate with placement of ECG leads. Alternatively,
consider upright PA and lateral radiographs for better localization.

## 2019-11-04 IMAGING — CT CT CERVICAL SPINE W/O CM
3 of 6 series · 14 of 33 positions shown, 16 images · non-contrast
Comparison: None.

CLINICAL DATA: Unwitnessed fall.

EXAM:
CT HEAD WITHOUT CONTRAST
CT CERVICAL SPINE WITHOUT CONTRAST
TECHNIQUE: Multidetector CT imaging of the head and cervical spine was
performed following the standard protocol without intravenous
contrast. Multiplanar CT image reconstructions of the cervical spine
were also generated.

[Series 5: coronal soft tissue · coronal · 0.28mm/px · 3 of 62 slices shown]
[im 16/62  bone]
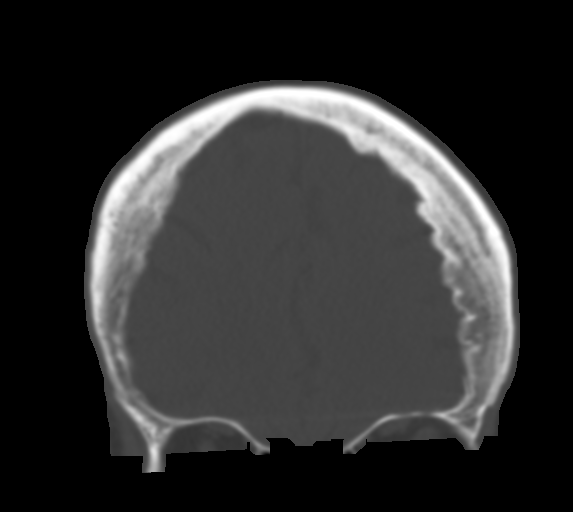
[im 31/62  bone]
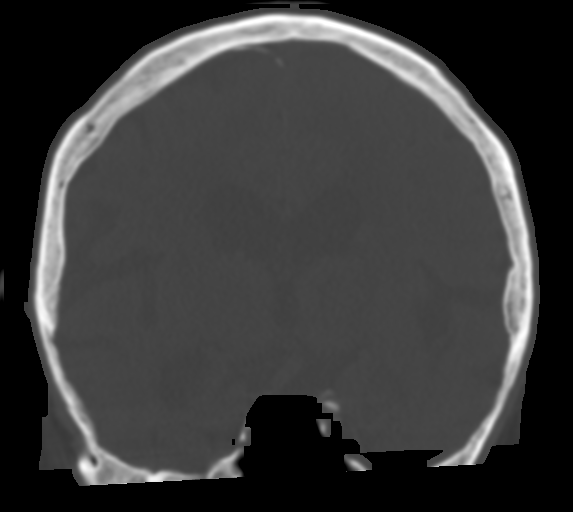
[im 46/62  bone]
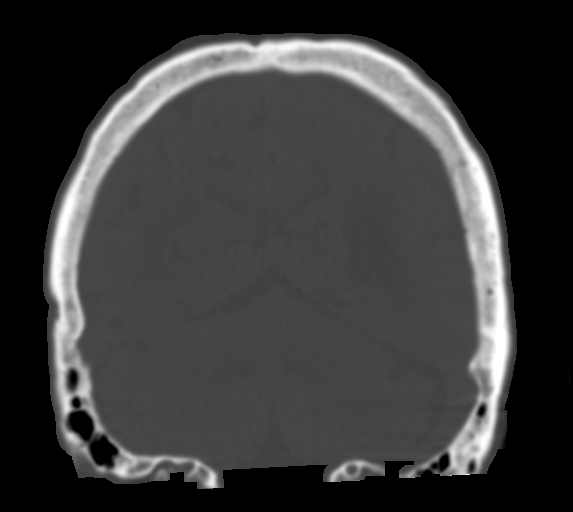

[Series 9: c spine soft · axial · 0.39mm/px · z∈[+1306,+1402]mm · 7 of 62 slices shown, 9 images]
[im 7/62  soft-tissue]
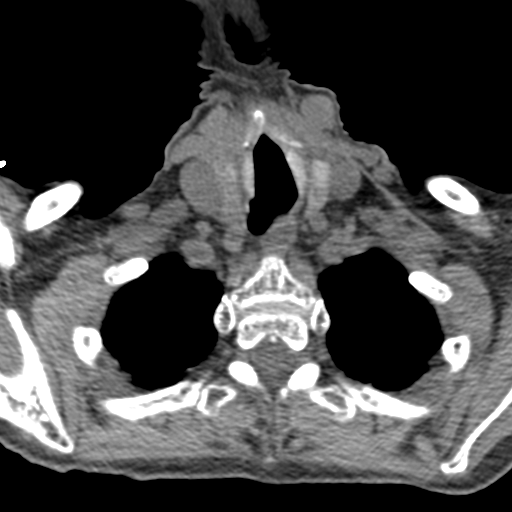
[im 7/62  bone]
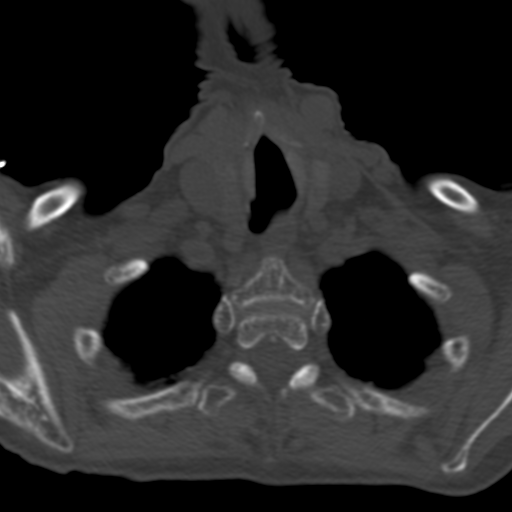
[im 14/62  bone]
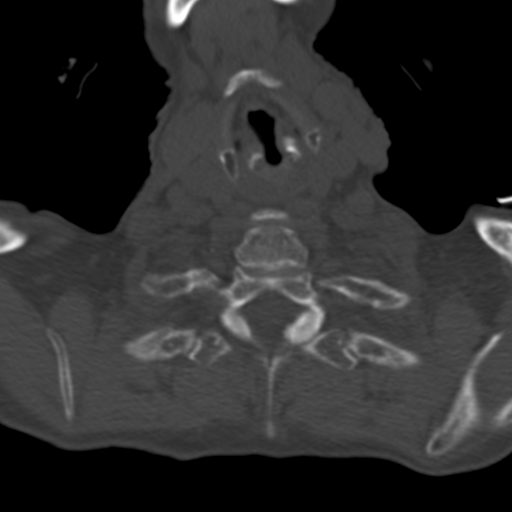
[im 21/62  bone]
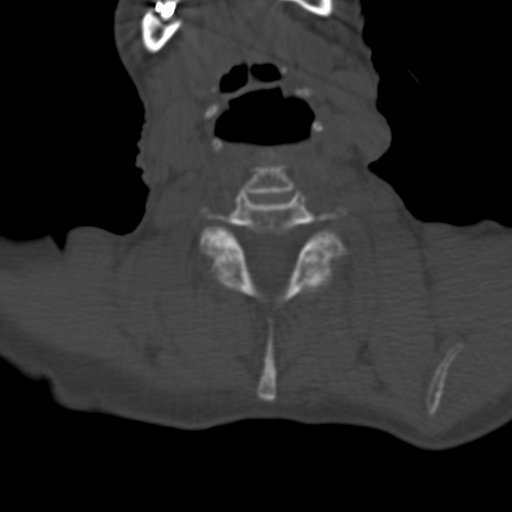
[im 34/62  bone]
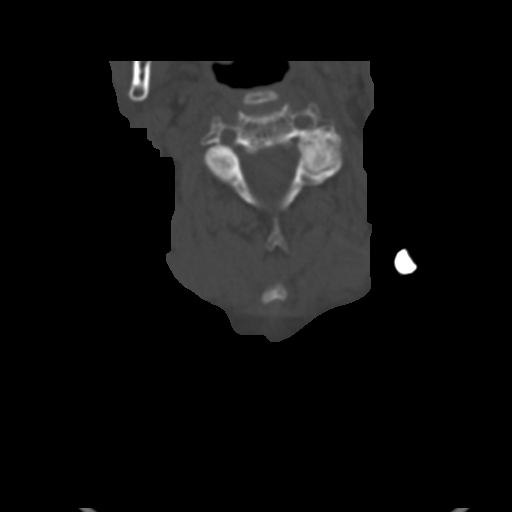
[im 41/62  soft-tissue]
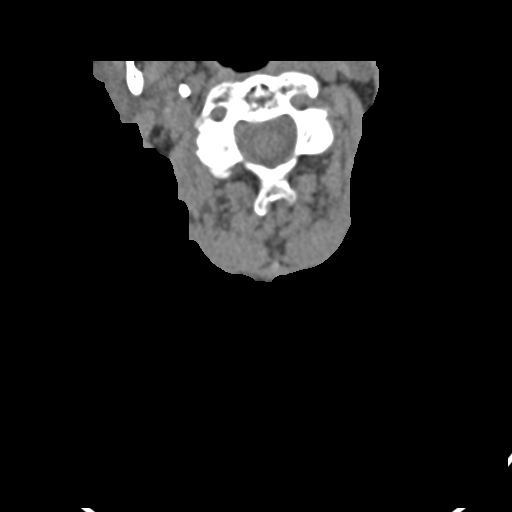
[im 41/62  bone]
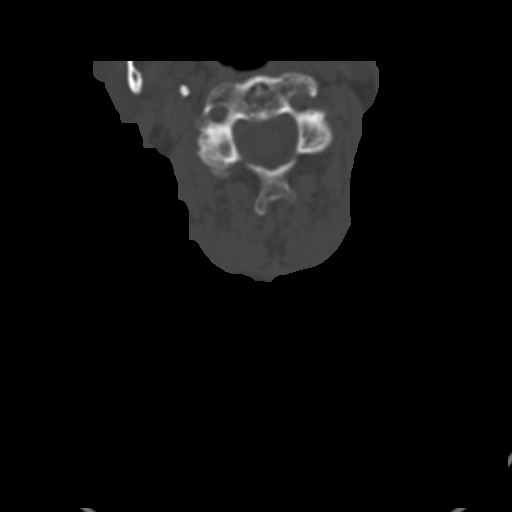
[im 48/62  bone]
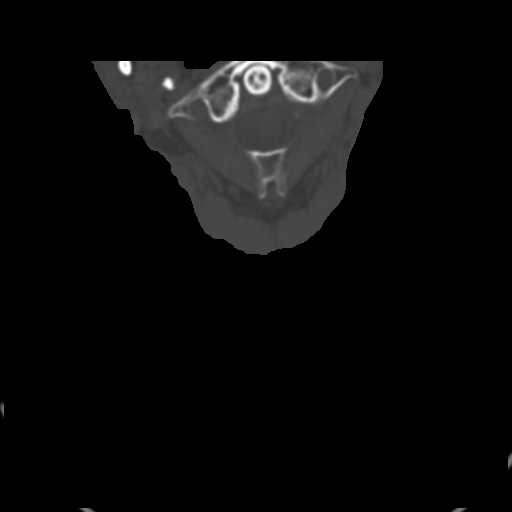
[im 55/62  bone]
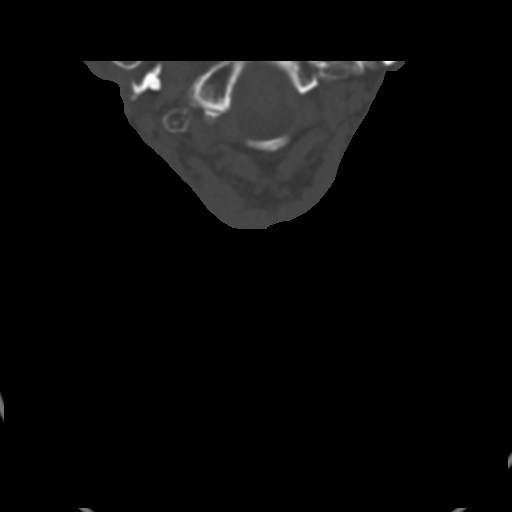

[Series 11: sagittal bone · sagittal · 0.23mm/px · 4 of 35 slices shown]
[im 7/35  bone]
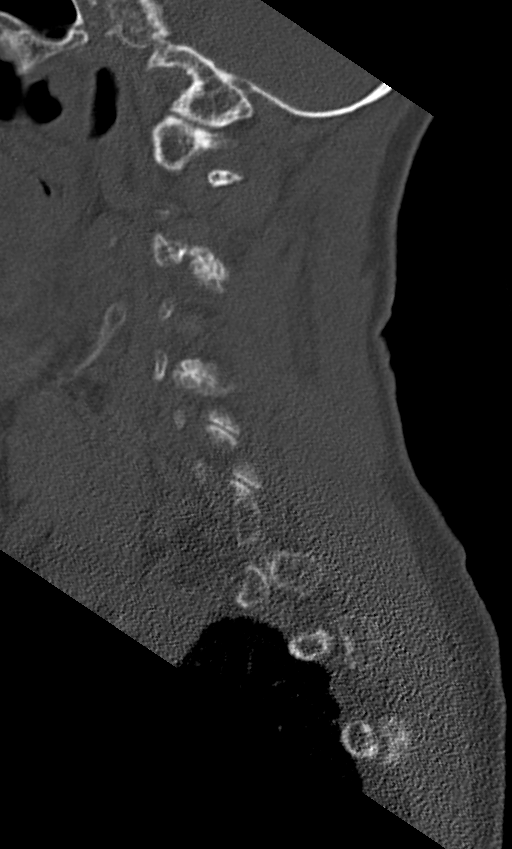
[im 14/35  bone]
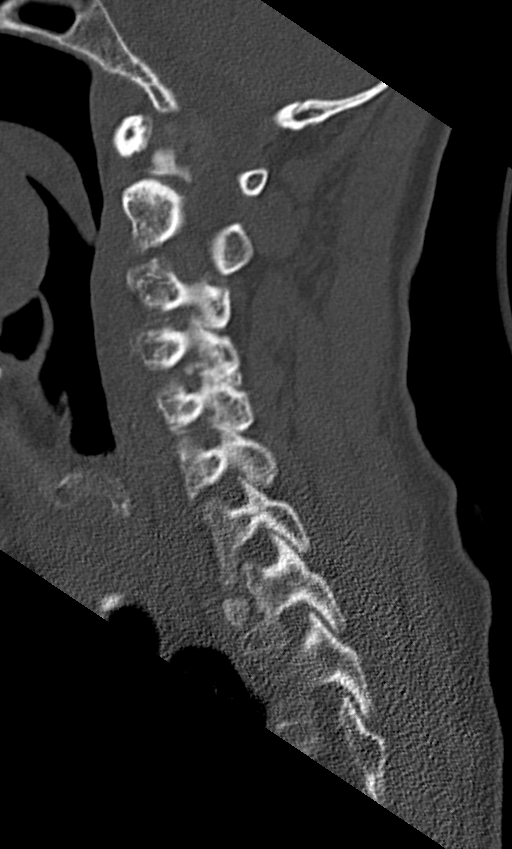
[im 21/35  bone]
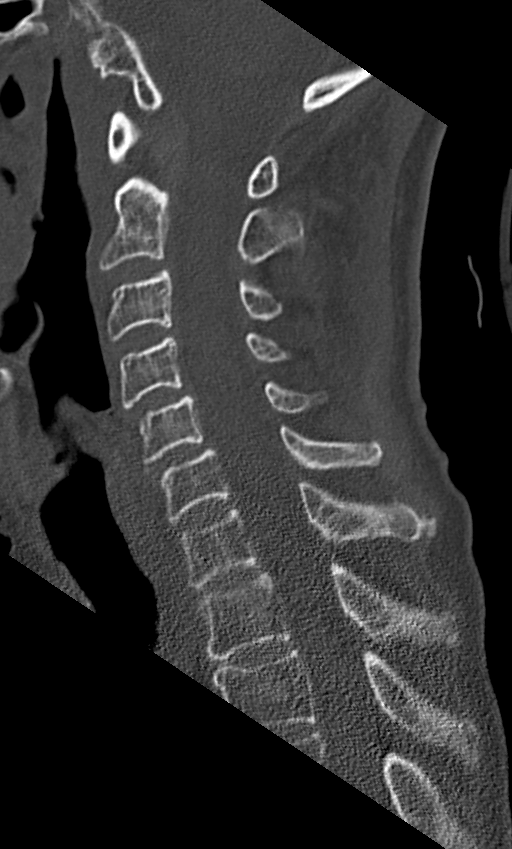
[im 28/35  bone]
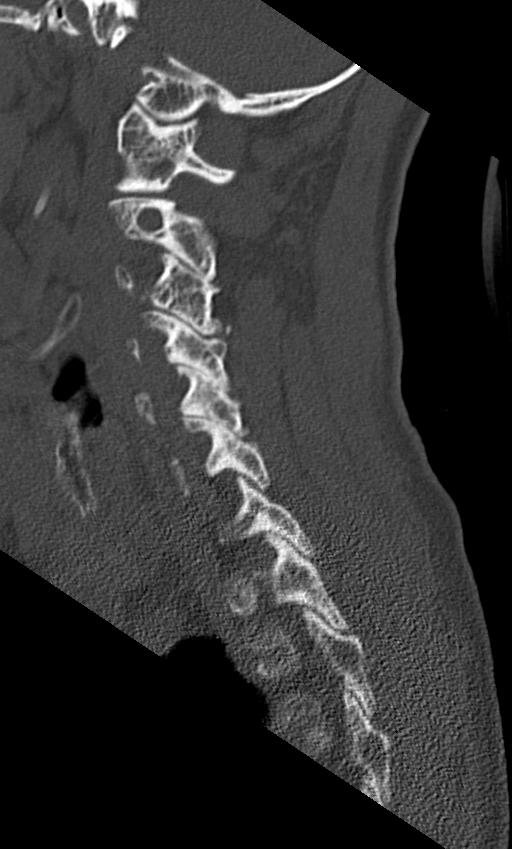

[14 of 33 positions shown; findings below may reference images not displayed]

FINDINGS: CT HEAD FINDINGS

Brain: No subdural, epidural, or subarachnoid hemorrhage.
Cerebellum, brainstem, and basal cisterns are normal. Ventricles and
sulci are prominent, likely due to volume loss. Moderate white
matter changes are noted. No acute cortical ischemia or infarct
identified. No mass effect or midline shift noted.

Vascular: Calcified atherosclerosis in the intracranial carotids.

Skull: Normal. Negative for fracture or focal lesion.

Sinuses/Orbits: No acute finding.

Other: None.

CT CERVICAL SPINE FINDINGS

Alignment: 2 mm of anterolisthesis of C4 versus C5 with no
associated fracture. 1 mm of anterolisthesis of C5 versus C6, also
with no associated fracture. No other malalignment.

Skull base and vertebrae: No acute fracture. No primary bone lesion
or focal pathologic process.

Soft tissues and spinal canal: No prevertebral fluid or swelling. No
visible canal hematoma.

Disc levels:  Multilevel degenerative changes.

Upper chest: Negative.

Other: No other abnormalities.
IMPRESSION: 1. No acute intracranial abnormalities. Moderate white matter
changes.
2. No fracture or traumatic malalignment in the cervical spine.
Minimal anterolisthesis of C4 versus C5 and C5 versus C6 is likely
due to facet degenerative changes at these levels.
